# Patient Record
Sex: Male | Born: 1965 | Race: Black or African American | Hispanic: No | Marital: Single
Health system: Southern US, Community
[De-identification: ages and names within clinical notes are randomized; demographics above are authoritative.]

---

## 2019-07-27 ENCOUNTER — Emergency Department: Payer: No Typology Code available for payment source

## 2019-07-27 ENCOUNTER — Emergency Department
Admission: EM | Admit: 2019-07-27 | Discharge: 2019-07-27 | Discharge status: Against Medical Advice | Payer: No Typology Code available for payment source | Attending: Emergency Medicine | Admitting: Emergency Medicine

## 2019-07-27 ENCOUNTER — Other Ambulatory Visit: Payer: Self-pay

## 2019-07-27 DIAGNOSIS — E876 Hypokalemia: Secondary | ICD-10-CM | POA: Insufficient documentation

## 2019-07-27 DIAGNOSIS — R569 Unspecified convulsions: Secondary | ICD-10-CM

## 2019-07-27 LAB — CBC WITH DIFFERENTIAL/PLATELET
Abs Immature Granulocytes: 0.01 10*3/uL (ref 0.00–0.07)
Basophils Absolute: 0.1 10*3/uL (ref 0.0–0.1)
Basophils Relative: 1 %
Eosinophils Absolute: 0.1 10*3/uL (ref 0.0–0.5)
Eosinophils Relative: 1 %
HCT: 41.9 % (ref 39.0–52.0)
Hemoglobin: 14.7 g/dL (ref 13.0–17.0)
Immature Granulocytes: 0 %
Lymphocytes Relative: 15 %
Lymphs Abs: 1.1 10*3/uL (ref 0.7–4.0)
MCH: 34.5 pg — ABNORMAL HIGH (ref 26.0–34.0)
MCHC: 35.1 g/dL (ref 30.0–36.0)
MCV: 98.4 fL (ref 80.0–100.0)
Monocytes Absolute: 0.7 10*3/uL (ref 0.1–1.0)
Monocytes Relative: 10 %
Neutro Abs: 5.4 10*3/uL (ref 1.7–7.7)
Neutrophils Relative %: 73 %
Platelets: 209 10*3/uL (ref 150–400)
RBC: 4.26 MIL/uL (ref 4.22–5.81)
RDW: 15.1 % (ref 11.5–15.5)
WBC: 7.4 10*3/uL (ref 4.0–10.5)
nRBC: 0 % (ref 0.0–0.2)

## 2019-07-27 LAB — COMPREHENSIVE METABOLIC PANEL WITH GFR
ALT: 13 U/L (ref 0–44)
AST: 56 U/L — ABNORMAL HIGH (ref 15–41)
Albumin: 3.8 g/dL (ref 3.5–5.0)
Alkaline Phosphatase: 141 U/L — ABNORMAL HIGH (ref 38–126)
Anion gap: 13 (ref 5–15)
BUN: <5 mg/dL — ABNORMAL LOW (ref 6–20)
CO2: 27 mmol/L (ref 22–32)
Calcium: 9 mg/dL (ref 8.9–10.3)
Chloride: 93 mmol/L — ABNORMAL LOW (ref 98–111)
Creatinine, Ser: 0.83 mg/dL (ref 0.61–1.24)
GFR calc Af Amer: >60 mL/min
GFR calc non Af Amer: >60 mL/min
Glucose, Bld: 144 mg/dL — ABNORMAL HIGH (ref 70–99)
Potassium: 3.2 mmol/L — ABNORMAL LOW (ref 3.5–5.1)
Sodium: 133 mmol/L — ABNORMAL LOW (ref 135–145)
Total Bilirubin: 1.1 mg/dL (ref 0.3–1.2)
Total Protein: 9.1 g/dL — ABNORMAL HIGH (ref 6.5–8.1)

## 2019-07-27 LAB — ETHANOL: Alcohol, Ethyl (B): <10 mg/dL (ref <10)

## 2019-07-27 LAB — ACETAMINOPHEN LEVEL: Acetaminophen (Tylenol), Serum: <10 ug/mL — ABNORMAL LOW (ref 10–30)

## 2019-07-27 LAB — SALICYLATE LEVEL: Salicylate Lvl: <7 mg/dL — ABNORMAL LOW (ref 7.0–30.0)

## 2019-07-27 LAB — MAGNESIUM: Magnesium: 1.5 mg/dL — ABNORMAL LOW (ref 1.7–2.4)

## 2019-07-27 MED ORDER — LEVETIRACETAM IN NACL 1000 MG/100ML IV SOLN
1000.0000 mg | Freq: Once | INTRAVENOUS | Status: AC
  Administered 2019-07-27: 1000 mg via INTRAVENOUS
  Filled 2019-07-27: qty 100

## 2019-07-27 MED ORDER — SODIUM CHLORIDE 0.9 % IV BOLUS
1000.0000 mL | Freq: Once | INTRAVENOUS | Status: AC
  Administered 2019-07-27: 1000 mL via INTRAVENOUS

## 2019-07-27 MED ORDER — POTASSIUM CHLORIDE CRYS ER 20 MEQ PO TBCR
40.0000 meq | EXTENDED_RELEASE_TABLET | Freq: Once | ORAL | Status: AC
  Administered 2019-07-27: 40 meq via ORAL
  Filled 2019-07-27: qty 2

## 2019-07-27 MED ORDER — MAGNESIUM SULFATE 2 GM/50ML IV SOLN
2.0000 g | Freq: Once | INTRAVENOUS | Status: AC
  Administered 2019-07-27: 2 g via INTRAVENOUS
  Filled 2019-07-27: qty 50

## 2019-07-27 MED ORDER — POTASSIUM CHLORIDE ER 10 MEQ PO TBCR: 20.0000 meq | EXTENDED_RELEASE_TABLET | Freq: Every day | ORAL | 0 refills | Status: AC

## 2019-07-27 MED ORDER — LEVETIRACETAM 500 MG PO TABS
500.0000 mg | ORAL_TABLET | Freq: Two times a day (BID) | ORAL | 0 refills | Status: AC
Start: 2019-07-27 — End: 2019-08-26

## 2019-07-27 MED ORDER — POTASSIUM CHLORIDE CRYS ER 20 MEQ PO TBCR: 40.0000 meq | EXTENDED_RELEASE_TABLET | Freq: Once | ORAL | Status: DC

## 2019-07-27 NOTE — ED Notes (Signed)
EDP at bedside. Pt questioning who he was with and where he wallet and cell phone is. Pt refusing care.

## 2019-07-27 NOTE — ED Notes (Signed)
EDP at bedside  

## 2019-07-27 NOTE — ED Notes (Signed)
Pt back from CT

## 2019-07-27 NOTE — ED Provider Notes (Signed)
[facility] Emergency Department Provider Note  ____________________________________________   First MD Initiated Contact with Patient 07/27/19 [phone]     (approximate)  I have reviewed the triage vital signs and the nursing notes.   HISTORY  Chief Complaint Seizures    HPI Colin Simmons is a 54 y.o. male who comes in from a restaurant after having a grand mal seizure.  Initially patient did not want to have lab work done but then family came and he was more willing to have work-up done.  Reports maybe having a seizure a long time ago.  Denies frequent alcohol use.  Denies stopping a few days ago anything that would suggest withdrawal.  He states that he does not really remember what happened.  According to family he was eating and then had a generalized tonic-clonic movement that lasted about 5 minutes then stopped and he was started to come back to baseline but it started having another seizure that lasted a few minutes and stopped again.  He did not fall onto the ground.  They were able to move him onto the ground laying on his side during the seizure.  Patient was postictal afterwards.  Seizure occurred x2, unclear what brought it on, nothing made it better.  Stopped on its own.  Intermittent.  Severe.   History reviewed. No pertinent past medical history.  There are no problems to display for this patient.  Prior to Admission medications   Not on File    Allergies Patient has no allergy information on record.  No family history on file.  Social History Social History   Tobacco Use  . Smoking status: Not on file  Substance Use Topics  . Alcohol use: Not on file  . Drug use: Not on file      Review of Systems Constitutional: No fever/chills Eyes: No visual changes. ENT: No sore throat. Cardiovascular: Denies chest pain. Respiratory: Denies shortness of breath. Gastrointestinal: No abdominal pain.  No nausea, no vomiting.  No diarrhea.   No constipation. Genitourinary: Negative for dysuria. Musculoskeletal: Negative for back pain. Skin: Negative for rash. Neurological: Negative for headaches, focal weakness or numbness. All other ROS negative ____________________________________________   PHYSICAL EXAM:  VITAL SIGNS: ED Triage Vitals  Enc Vitals Group     BP 07/27/19 0935 (!) 150/103     Pulse Rate 07/27/19 0935 (!) 120     Resp 07/27/19 0935 18     Temp 07/27/19 0935 98.8 F (37.1 C)     Temp Source 07/27/19 0935 Oral     SpO2 07/27/19 0935 97 %     Weight --      Height --      Head Circumference --      Peak Flow --      Pain Score 07/27/19 0936 0     Pain Loc --      Pain Edu? --      Excl. in GC? --     Constitutional: Alert and oriented. Well appearing and in no acute distress. Eyes: Conjunctivae are normal. EOMI. Head: Atraumatic. Nose: No congestion/rhinnorhea. Mouth/Throat: Mucous membranes are moist.   Neck: No stridor. Trachea Midline. FROM Cardiovascular: Normal rate, regular rhythm. Grossly normal heart sounds.  Good peripheral circulation. Respiratory: Normal respiratory effort.  No retractions. Lungs CTAB. Gastrointestinal: Soft and nontender. No distention. No abdominal bruits.  Musculoskeletal: No lower extremity tenderness nor edema.  No joint effusions. Neurologic:  Normal speech and language. No gross focal neurologic  deficits are appreciated.  Skin:  Skin is warm, dry and intact. No rash noted. Psychiatric: Mood and affect are normal. Speech and behavior are normal. GU: Deferred   ____________________________________________   LABS (all labs ordered are listed, but only abnormal results are displayed)  Labs Reviewed - No data to display ____________________________________________   ED ECG REPORT I, Concha Se, the attending physician, personally viewed and interpreted this ECG.  Sinus tachycardia rate of 120, no ST elevation, no T wave inversions, normal  intervals ____________________________________________  RADIOLOGY  Official radiology report(s): CT Head Wo Contrast  Result Date: 07/27/2019 CLINICAL DATA:  Seizure, nontraumatic. Additional provided: History of seizure. EXAM: CT HEAD WITHOUT CONTRAST TECHNIQUE: Contiguous axial images were obtained from the base of the skull through the vertex without intravenous contrast. COMPARISON:  No pertinent prior studies available for comparison. FINDINGS: Brain: There is mild generalized parenchymal atrophy. There is no acute intracranial hemorrhage. No demarcated cortical infarct. No extra-axial fluid collection. No evidence of intracranial mass. No midline shift. Vascular: No hyperdense vessel. Skull: Normal. Negative for fracture or focal lesion. Sinuses/Orbits: Visualized orbits show no acute finding. Mild mucosal thickening and a small amount of frothy secretions within the inferior frontal sinus. No significant mastoid effusion. IMPRESSION: No CT evidence of acute intracranial abnormality. Mild generalized parenchymal atrophy. Frontal sinusitis. Electronically Signed   By: Jackey Loge DO   On: 07/27/2019 11:28    ____________________________________________   PROCEDURES  Procedure(s) performed (including Critical Care):  Procedures   ____________________________________________   INITIAL IMPRESSION / ASSESSMENT AND PLAN / ED COURSE  Colin Simmons was evaluated in Emergency Department on 07/27/2019 for the symptoms described in the history of present illness. He was evaluated in the context of the global COVID-19 pandemic, which necessitated consideration that the patient might be at risk for infection with the SARS-CoV-2 virus that causes COVID-19. Institutional protocols and algorithms that pertain to the evaluation of patients at risk for COVID-19 are in a state of rapid change based on information released by regulatory bodies including the CDC and federal and state organizations. These  policies and algorithms were followed during the patient's care in the ED.    Patient is a 54 year old who comes in for seizure.  Patient reports a history of seizure but is never had a full work-up for it.  Will get labs to evaluate Electra abnormalities, AKI.  Does not look like he is withdrawing and denies recently stopping alcohol.  Will get CT head evaluate for mass, intracranial hemorrhage although seems less likely.  Family witnessed the seizures and stated that he did not hit his head.  Denies any other injuries.  Potassium slightly low at 3.2 give some oral repletion.  Patient sodium chloride also slightly low given a liter of fluids.  Magnesium was slightly low and given 2 g of IV mag.  Discussed with Dr. Thad Ranger who recommended admission for MRI, EEG especially since patient has not had full work-up previously.  Discussed with patient and he declines admission.  He states that he is going to follow-up at the [place].  Discussed with patient that he has been stating that [facility] and that I would recommend [place] at least coordinating transfer to the [place].  Patient refuses transfer.  Patient has capacity make this decision.  He is able to repeat back to me the risk including death and permanent disability however he does not want to stay.  His brother  is at bedside and understands that patient has a capacity make this decision.  He will try to help facilitate patient getting follow-up with the [place].  I discussed with discussed with Dr. Doy Mince and she was okay with starting 500 mg of Keppra twice daily until he gets follow-up.  Patient states that he is willing to take this medicine.  We will give a 1 g load prior to leaving.  At this time patient will be leaving [place] however will still provide patient with Keppra prescription.     ____________________________________________   FINAL CLINICAL IMPRESSION(S) / ED DIAGNOSES   Final diagnoses:   Seizure ([place])  Hypokalemia  Hypomagnesemia      MEDICATIONS GIVEN DURING THIS VISIT:  Medications  magnesium sulfate IVPB 2 g 50 mL (2 g Intravenous New Bag/Given 07/27/19 1213)  levETIRAcetam (KEPPRA) IVPB 1000 mg/100 mL premix (has no administration in time range)  potassium chloride SA (KLOR-CON) CR tablet 40 mEq (has no administration in time range)  potassium chloride SA (KLOR-CON) CR tablet 40 mEq (40 mEq Oral Given 07/27/19 1214)  sodium chloride 0.9 % bolus 1,000 mL (1,000 mLs Intravenous New Bag/Given 07/27/19 1214)     ED Discharge Orders         Ordered    levETIRAcetam (KEPPRA) 500 MG tablet  2 times daily     Discontinue  Reprint     07/27/19 1247    potassium chloride (KLOR-CON) 10 MEQ tablet  Daily     Discontinue  Reprint     07/27/19 1247           Note:  This document was prepared using Dragon voice recognition software and may include unintentional dictation errors.   Vanessa [place], MD 07/27/19 1249

## 2019-07-27 NOTE — ED Notes (Signed)
Pt verbalizes understanding that he is leaving AMA, brother at bedside. Ambulates safely. Pt alert and oriented X 4, stable for discharge. RR even and unlabored, color WNL. Discussed discharge instructions and follow up when appropriate. Instructed to follow up with ER for any life threatening symptoms or concerns that patient or family of patient may have

## 2019-07-27 NOTE — ED Notes (Signed)
Pt essentially refusing care, states that he wants to go, that he feels fine. Pt asks to use restroom, unhooked from monitor by this RN and pt states he wants his privacy, asking RN to leave. Asked to give urine sample, states "it'll have to wait". Pt hostile when asking medical questions. Poor historian. Requested pt stay to speak with doctor and allow family to come back to room before deciding to refuse care at this time.

## 2019-07-27 NOTE — ED Notes (Signed)
Pt complaint with medical care at this time. Brother at bedside. Pt urinated in toilet prior to providing sample; aware of need and has urinal at bedside.

## 2019-07-27 NOTE — ED Notes (Signed)
EDP explained risks of leaving AMA, pt able to repeat them back to EDP. Pt insistent upon leaving and refusing care.

## 2019-07-27 NOTE — ED Triage Notes (Signed)
BIB [facility] from restaurant where family witnessed patient having "grand mal" seizure. Family reports hx of seizure, pt denies. Pt post ictal with EMS. Pt did have emesis X 1. Pt A&OX4

## 2019-07-27 NOTE — ED Notes (Signed)
Family of pt has arrived and is at bedside. Will notify EDP

## 2019-07-27 NOTE — Discharge Instructions (Addendum)
We recommended admission or transfer to the [facility] for further seizure work-up and to make sure he did not have another seizure.  You have elected to want to leave AGAINST MEDICAL ADVICE and go home.  We are willing to start you on some antiseizure medicine to prevent a future seizure but you still need to go to the [place] to follow-up for MRI, EEG.  Take the Keppra to prevent seizures and the potassium pills to help with your low K.  Return to the ER if you decide you want to want further work-up

## 2021-02-19 IMAGING — CT CT HEAD W/O CM
4 series · 15 of 47 positions shown, 17 images · non-contrast
Comparison: No pertinent prior studies available for comparison.

CLINICAL DATA: Seizure, nontraumatic. Additional provided: History
of seizure.

EXAM:
CT HEAD WITHOUT CONTRAST
TECHNIQUE: Contiguous axial images were obtained from the base of the skull
through the vertex without intravenous contrast.

[Series 2: head wo · axial · 0.48mm/px · z∈[+342,+452]mm · 7 of 30 slices shown, 9 images]
[im 4/30  brain]
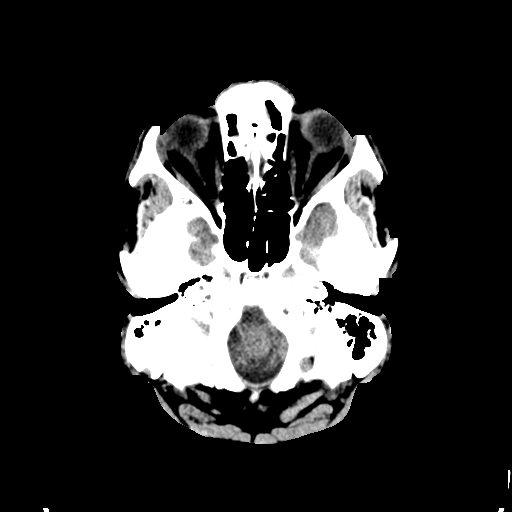
[im 4/30  bone]
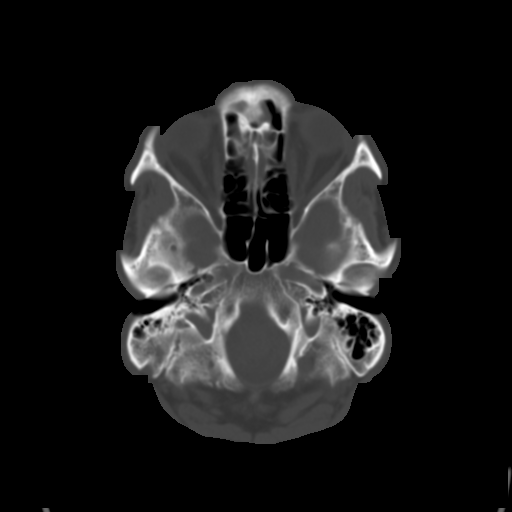
[im 8/30  brain]
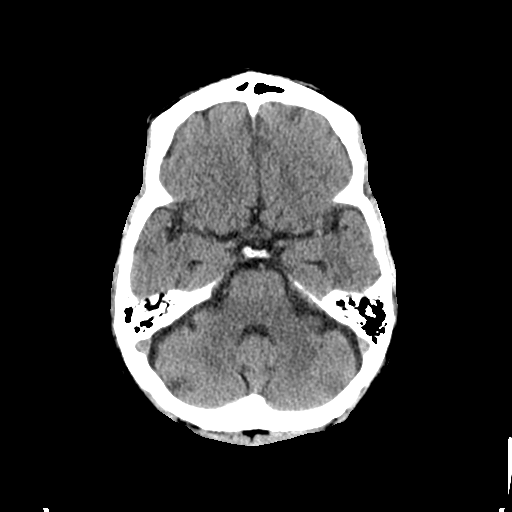
[im 11/30  brain]
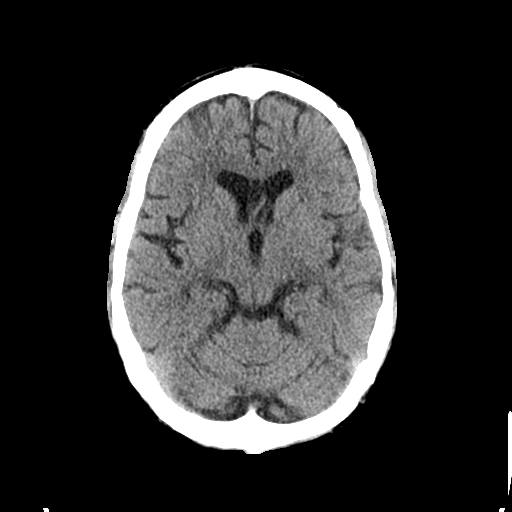
[im 15/30  brain]
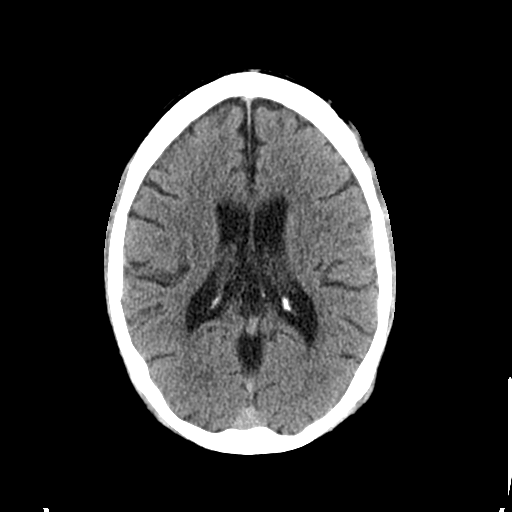
[im 19/30  brain]
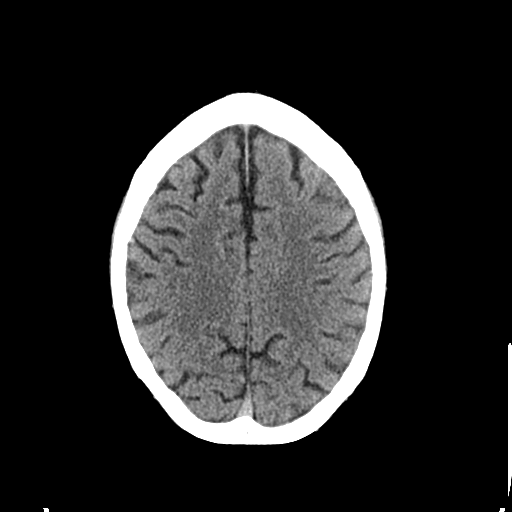
[im 19/30  bone]
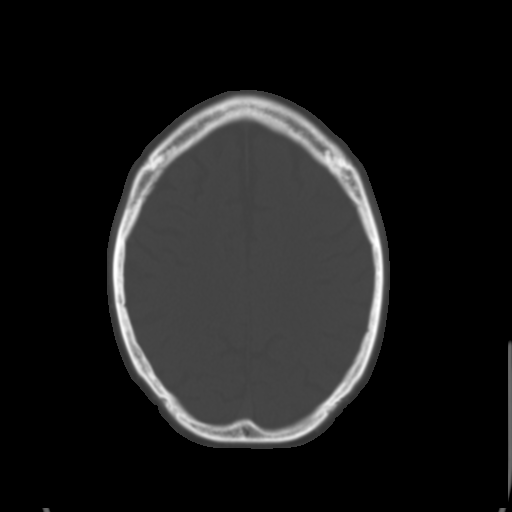
[im 22/30  brain]
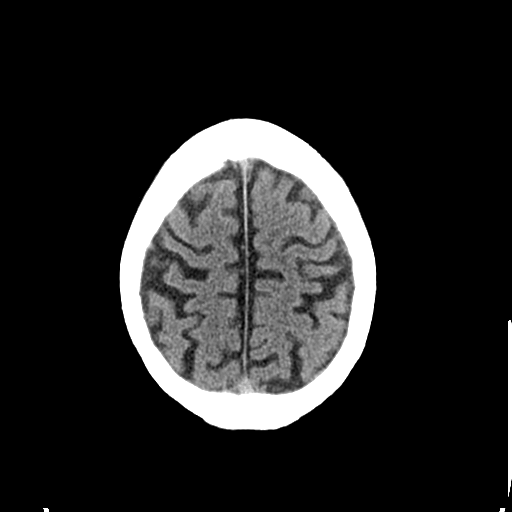
[im 26/30  brain]
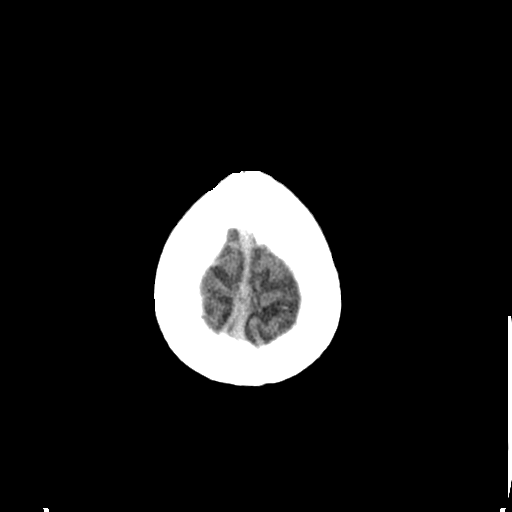

[Series 3: head bone · axial · 0.48mm/px · z∈[+341,+355]mm · 2 of 75 slices shown]
[im 8/75  bone]
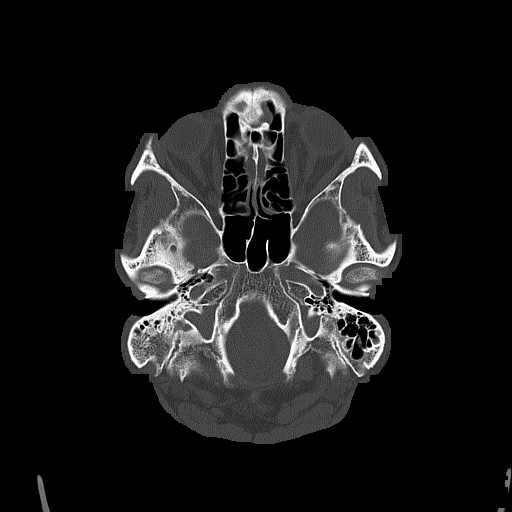
[im 15/75  bone]
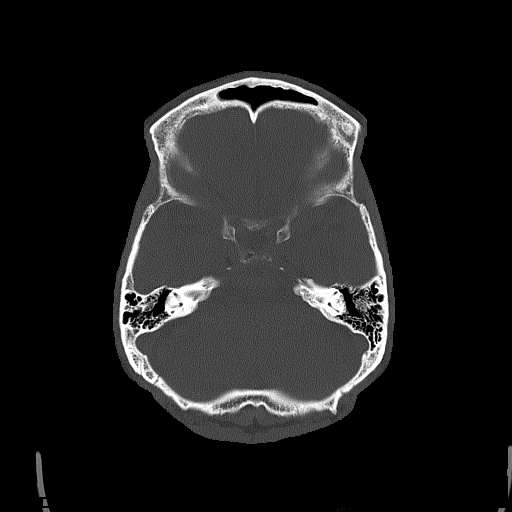

[Series 4: coronal soft tissue · coronal · 0.31mm/px · 3 of 61 slices shown]
[im 21/61  brain]
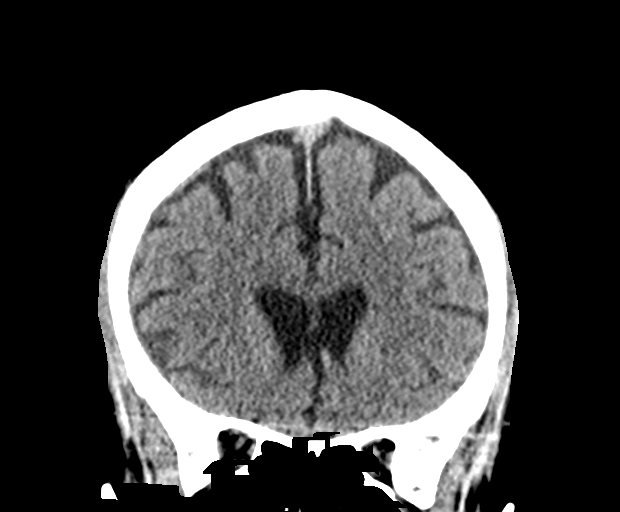
[im 27/61  brain]
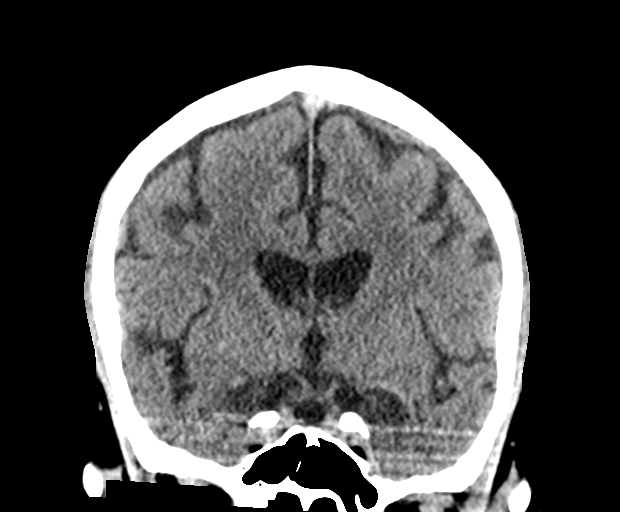
[im 34/61  brain]
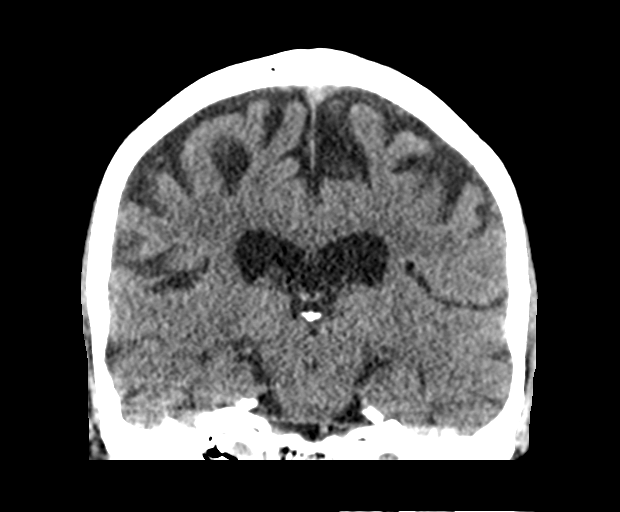

[Series 5: sagittal soft tissue · sagittal · 0.31mm/px · 3 of 52 slices shown]
[im 18/52  brain]
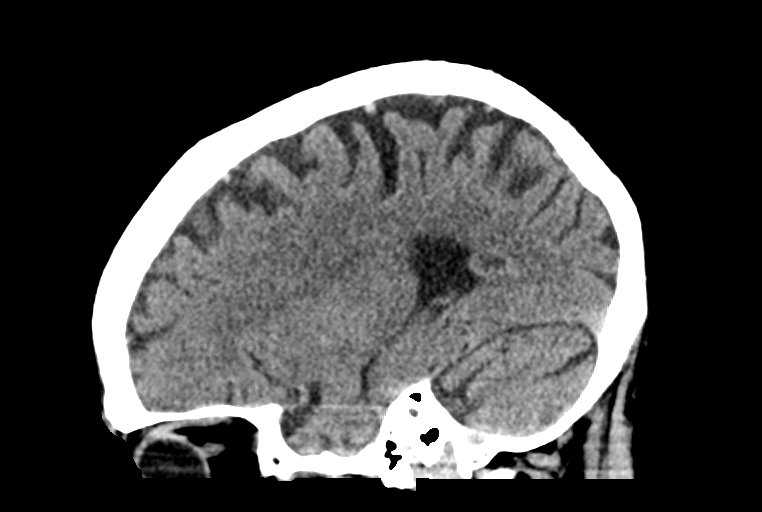
[im 26/52  brain]
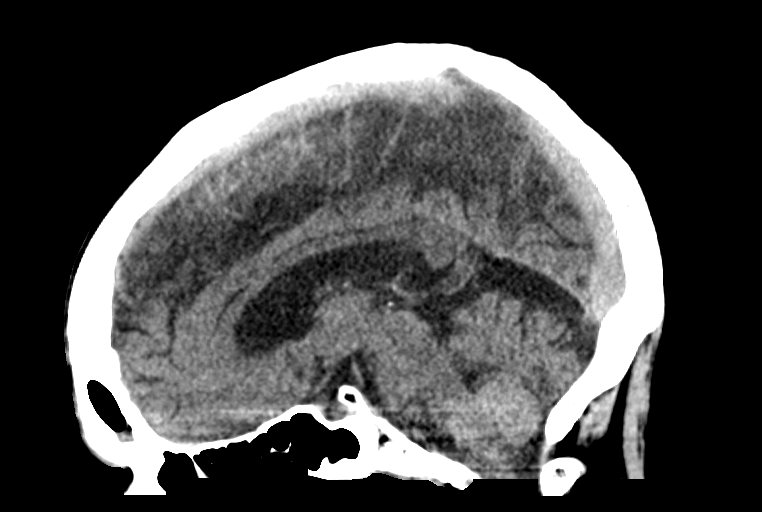
[im 35/52  brain]
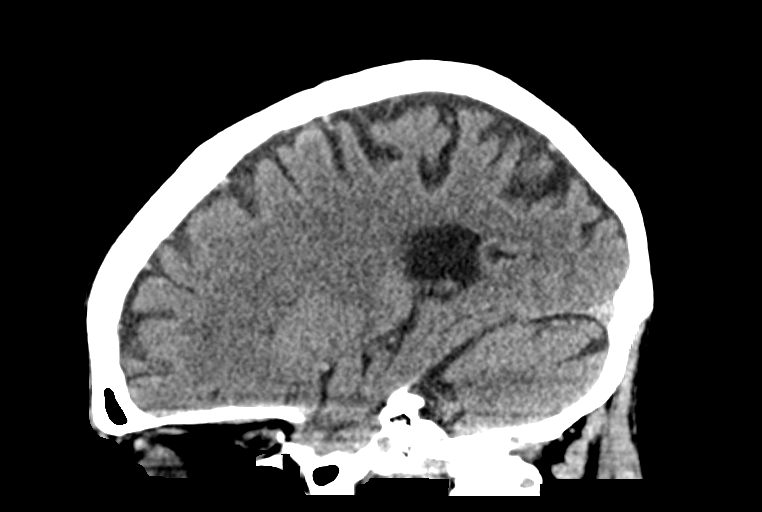

[15 of 47 positions shown; findings below may reference images not displayed]

FINDINGS: Brain:

There is mild generalized parenchymal atrophy.

There is no acute intracranial hemorrhage.

No demarcated cortical infarct.

No extra-axial fluid collection.

No evidence of intracranial mass.

No midline shift.

Vascular: No hyperdense vessel.

Skull: Normal. Negative for fracture or focal lesion.

Sinuses/Orbits: Visualized orbits show no acute finding. Mild
mucosal thickening and a small amount of frothy secretions within
the inferior frontal sinus. No significant mastoid effusion.
IMPRESSION: No CT evidence of acute intracranial abnormality.

Mild generalized parenchymal atrophy.

Frontal sinusitis.
